# Patient Record
Sex: Female | Born: 1995 | Race: White | Hispanic: No | Marital: Single | Smoking: Never smoker
Health system: Southern US, Community
[De-identification: ages and names within clinical notes are randomized; demographics above are authoritative.]

## PROBLEM LIST (undated history)

## (undated) ENCOUNTER — Emergency Department: Admission: EM | Discharge status: Absent without leave | Payer: Self-pay

## (undated) DIAGNOSIS — K589 Irritable bowel syndrome without diarrhea: Secondary | ICD-10-CM

## (undated) DIAGNOSIS — F419 Anxiety disorder, unspecified: Secondary | ICD-10-CM

## (undated) DIAGNOSIS — L709 Acne, unspecified: Secondary | ICD-10-CM

## (undated) DIAGNOSIS — F329 Major depressive disorder, single episode, unspecified: Secondary | ICD-10-CM

## (undated) DIAGNOSIS — F32A Depression, unspecified: Secondary | ICD-10-CM

## (undated) HISTORY — DX: Anxiety disorder, unspecified: F41.9

## (undated) HISTORY — DX: Irritable bowel syndrome, unspecified: K58.9

## (undated) HISTORY — DX: Depression, unspecified: F32.A

## (undated) HISTORY — DX: Acne, unspecified: L70.9

## (undated) HISTORY — DX: Major depressive disorder, single episode, unspecified: F32.9

---
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
## 2018-03-20 ENCOUNTER — Other Ambulatory Visit: Payer: Self-pay | Admitting: Internal Medicine

## 2018-03-20 ENCOUNTER — Other Ambulatory Visit (HOSPITAL_COMMUNITY)
Admission: RE | Admit: 2018-03-20 | Discharge: 2018-03-20 | Disposition: A | Discharge status: Home / Self Care | Payer: Managed Care, Other (non HMO) | Source: Ambulatory Visit | Attending: Internal Medicine | Admitting: Internal Medicine

## 2018-03-20 DIAGNOSIS — Z124 Encounter for screening for malignant neoplasm of cervix: Secondary | ICD-10-CM | POA: Insufficient documentation

## 2018-03-23 LAB — CYTOLOGY - PAP

## 2018-08-07 ENCOUNTER — Other Ambulatory Visit: Payer: Self-pay | Admitting: Gastroenterology

## 2018-08-07 DIAGNOSIS — R140 Abdominal distension (gaseous): Secondary | ICD-10-CM

## 2018-08-17 ENCOUNTER — Other Ambulatory Visit: Payer: Managed Care, Other (non HMO)

## 2018-08-25 ENCOUNTER — Other Ambulatory Visit: Payer: Managed Care, Other (non HMO)

## 2018-09-06 ENCOUNTER — Ambulatory Visit
Admission: RE | Admit: 2018-09-06 | Discharge: 2018-09-06 | Disposition: A | Discharge status: Home / Self Care | Payer: Managed Care, Other (non HMO) | Source: Ambulatory Visit | Attending: Gastroenterology | Admitting: Gastroenterology

## 2018-09-06 DIAGNOSIS — R140 Abdominal distension (gaseous): Secondary | ICD-10-CM

## 2019-03-06 ENCOUNTER — Other Ambulatory Visit: Payer: Self-pay | Admitting: Gastroenterology

## 2019-03-06 DIAGNOSIS — R1084 Generalized abdominal pain: Secondary | ICD-10-CM

## 2019-03-19 ENCOUNTER — Inpatient Hospital Stay: Admission: RE | Admit: 2019-03-19 | Payer: Managed Care, Other (non HMO) | Source: Ambulatory Visit

## 2019-05-09 ENCOUNTER — Ambulatory Visit
Admission: RE | Admit: 2019-05-09 | Discharge: 2019-05-09 | Disposition: A | Discharge status: Home / Self Care | Payer: Managed Care, Other (non HMO) | Source: Ambulatory Visit | Attending: Gastroenterology | Admitting: Gastroenterology

## 2019-05-09 ENCOUNTER — Encounter: Payer: Self-pay | Admitting: Radiology

## 2019-05-09 DIAGNOSIS — R1084 Generalized abdominal pain: Secondary | ICD-10-CM

## 2019-05-09 MED ORDER — IOPAMIDOL (ISOVUE-300) INJECTION 61%
100.0000 mL | Freq: Once | INTRAVENOUS | Status: AC | PRN
  Administered 2019-05-09: 100 mL via INTRAVENOUS

## 2019-05-28 ENCOUNTER — Other Ambulatory Visit: Payer: Managed Care, Other (non HMO)

## 2019-07-18 ENCOUNTER — Ambulatory Visit: Payer: Managed Care, Other (non HMO) | Admitting: Dietician

## 2019-08-23 ENCOUNTER — Other Ambulatory Visit: Payer: Self-pay

## 2019-08-23 ENCOUNTER — Encounter: Payer: Self-pay | Admitting: Dietician

## 2019-08-23 ENCOUNTER — Encounter: Payer: Managed Care, Other (non HMO) | Attending: Gastroenterology | Admitting: Dietician

## 2019-08-23 DIAGNOSIS — K589 Irritable bowel syndrome without diarrhea: Secondary | ICD-10-CM | POA: Diagnosis not present

## 2019-08-23 NOTE — Progress Notes (Signed)
Medical Nutrition Therapy:  Appt start time: 1130 end time:  1220.   Assessment:  Primary concerns today: Bloating, abdominal pain.   Body Composition Scale Date 08/23/2019  Current Body Weight 119.6 lbs  Total Body Fat % 15.5%  Visceral Fat 3  Fat-Free Mass % 84.4%   Total Body Water % 56.7%  Muscle-Mass lbs 32.5  BMI 19.5  Body Fat Displacement          Torso  lbs 11.4         Left Leg  lbs 2.2         Right Leg  lbs 2.2         Left Arm  lbs 1.1         Right Arm   lbs 1.1   Pt reports being seen by different providers multiple times, and doesn't know what to do. Pt reported bloating started three years ago, and became painful. Pt would go through cycles of painful bloating and remission. Pt had an ultrasound of kidneys and abdomen and a CT scan, results were negative.  Pt reports nausea recently, but no vomiting. Pt reports taking an antacid. Pt reports cutting out fiber recently, and constipation occurred. Pt reports trying to journal foods previously, getting frustrated and stopping Pt reports taking everlywell test, and avoids foods that showed up as sensitive. Pt has been paying attention to what foods cause symptoms. Pt reports alcohol usually doesn't cause symptoms. 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 Pt reports slight hair loss previously, but no brittle nails. Pt reports taking benefiber in the past, but had little difference. Pt reports hiccups, burping, and pain in her chest. Potentially indicative of GERD Pt reports drinking premier protein due to low calories but ample protein. Pt reports not wanting to gain weight. Pt reports having anxiety, and that it can intensify GI symptoms. Pt reports not having fear or anxiety over food.  Pt reports considering taking an anxiety medication. Pt reports just getting a therapist. Pt has latex allergy    Preferred Learning Style:   No preference indicated   Learning  Readiness:   Contemplating    MEDICATIONS: Acid reducer as needed, See chart   DIETARY INTAKE:  Usual eating pattern includes 3 meals and 2 snacks per day when not symptomatic.  Everyday foods include deli meat, tuna .  Avoided foods include FODMAP.    24-hr recall:  B ( AM): None reported  Snk ( AM): none reported  L ( PM): Premier proetin Snk ( PM): corn Crackers with deli meat D ( PM): premier protein Snk ( PM): crackers kiwi deli meat Beverages: water (4-5 16 oz bottles)  Usual physical activity: ADL  Progress Towards Goal(s):  In progress.   Nutritional Diagnosis:  NB-1.1 Food and nutrition-related knowledge deficit As related to IBS and bloating.  As evidenced by Continued symptoms of GI discomfort and bloating.    Intervention:  Nutrition Education.  Discuss incidence of symptoms with pt. and work with pt to identify potential combinations of foods that may lead to exacerbation of symptoms. Educate pt on the role of stress and anxiety on GI function and discomfort. Educate pt on the different forms of fiber in her food choices and her potential for intolerance in various forms. This includes inulin, which was present in pt 24 hr recall. Recommend pt take a detailed food and symptom log for the next two weeks, and follow up with dietitian. Advise  pt to contact her PCP about potentially starting an antacid.  Recommendations:  Keep track of food and symptoms. This includes all food and beverages. If you eat out, please indicate this.   Work to eat consistently throughout the day.   Begin with all cooked foods.  Chicken, fish, broccoli, peas   You can continue to eat fruits uncooked as long as they are tolerated.   Low fat yogurt, Yoplait or Activia.   Discuss the possibility of an acid reducer with your doctor.   Consider drinking alkaline water pH 8.8 and seeing how it is tolerated. You can find it at any grocery store.  Teaching Method Utilized:   Visual Auditory    Barriers to learning/adherence to lifestyle change: Frustration with inconsistency of symptoms  Demonstrated degree of understanding via:  Teach Back   Monitoring/Evaluation:  Dietary intake and symptom log, exercise, and body weight in 2 week(s).

## 2019-08-23 NOTE — Patient Instructions (Addendum)
Keep track of food and symptoms.  This includes all food and beverages, and if you eat out please indicate this. Work to eat consistently throughout the day.  Begin with all cooked foods. Chicken, fish, broccoli, peas  You can continue to eat fruits uncooked as long as they are tolerated.  Low fat yogurt, Yoplait or Activia.  Discuss the possibility of an acid reducer with your doctor.  Consider drinking alkaline water pH 8.8 and seeing how it is tolerated. You can find it at any grocery store.

## 2019-09-13 ENCOUNTER — Encounter: Payer: Managed Care, Other (non HMO) | Attending: Gastroenterology | Admitting: Dietician

## 2019-09-13 ENCOUNTER — Other Ambulatory Visit: Payer: Self-pay

## 2019-09-13 ENCOUNTER — Encounter: Payer: Self-pay | Admitting: Dietician

## 2019-09-13 VITALS — Wt 117.5 lb

## 2019-09-13 DIAGNOSIS — K589 Irritable bowel syndrome without diarrhea: Secondary | ICD-10-CM | POA: Insufficient documentation

## 2019-09-13 NOTE — Patient Instructions (Addendum)
Continue to log your food and pain intensity.  Get in 64 ounces of water a day.  Keep drinking alkaline water!  Try Benefiber supplement once daily when eliminating fruits.  Remove latex containing foods, and assess symptoms. These foods include  Avocado  Banana  Chestnut  Kiwi  Apple  Carrot  Celery  Papaya  Potatoes  Tomatoes  Melons

## 2019-09-13 NOTE — Progress Notes (Signed)
Medical Nutrition Therapy:  Appt start time: 0210 end time: 0240.   Assessment:  Primary concerns today: Bloating, abdominal pain.   Body Composition Scale Date 08/23/2019  09/13/2019  Current Body Weight 119.6 lbs 117.5 lbs  Total Body Fat % 15.5% 14.6  Visceral Fat 3 2  Fat-Free Mass % 84.4% 85.3   Total Body Water % 56.7% 57.1  Muscle-Mass lbs 32.5 32.5  BMI 19.5 19.2  Body Fat Displacement           Torso  lbs 11.4 10.5         Left Leg  lbs 2.2 2.1         Right Leg  lbs 2.2 2.1         Left Arm  lbs 1.1 1.0         Right Arm   lbs 1.1 1.0    Pt reports that if she gets pain from a food it will linger throughout the day.  Pt brought food log, rated pain on a scale of 5. 3 or 4 is where she states it becomes uncomfortable. Starts every day at a 2. Pt was on vacation and stopped logging foods after 1 week. Reports onset of symptoms within a few minutes of her first bite of food. Reports bloating constantly. Contsant pressure under her lower, left ribs. Pizza causes severe symptoms. Pt reports trying to drink a gallon of water a day for weight loss and skin health. Pt reports going for walks and runs for exercise. Exercise doesn't really affect her symptoms. Pt reports history of stress eating, and eating 80% healthy/20% "whatever" or junkfood. Pt felt she was her healthiest following this diet. Stopped taking OTC acid reducer because pain hasn't been as bad recently. Stopped socializing due to anxiety, and wants to get back out more often because she misses it.   Preferred Learning Style:   No preference indicated   Learning Readiness:   Ready   MEDICATIONS: Acid reducer as needed, no changes in medication   DIETARY INTAKE:  Usual eating pattern includes 3 meals and 2 snacks per day when not symptomatic.  Everyday foods include deli meat, tuna .  Avoided foods include FODMAP.    24-hr recall:  B ( AM): Yogurt, 2 kiwi fruit Snk ( AM): none L ( PM): [employer]  brown rice with vegetables frozen dinner, [employer] coffee caramel frap Snk ( PM): Banana and peanut butter D ( PM): McDonalds fries, diet coke Snk ( PM): One Maple donut, protein bar. Beverages: water (4-5 16 oz bottles)  Usual physical activity: ADL, walks and/or runs multiple times a week  Progress Towards Goal(s):  In progress.   Nutritional Diagnosis:  NB-1.1 Food and nutrition-related knowledge deficit As related to IBS and bloating.  As evidenced by Continued symptoms of GI discomfort and bloating.    Intervention:  Nutrition Education.  Advise patient to continue logging food and symptoms, but also indicate episodes of stress and/or anxiety on the log as well. Educate patient on the risks of overconsumption of water, recommend 64 oz a day for adequate hydration. Educate patient on latex containing foods, and advise patient to eliminate these foods from her diet. If symptoms subside, slowly reintroduce foods to assess tolerance.  Recommendations:  Continue to log your food and pain intensity.  Get in 64 ounces of water a day.  Keep drinking alkaline water!  Try Benefiber supplement once daily when eliminating fruits.  Remove latex containing foods, and assess symptoms. These  foods include  Avocado  Banana  Chestnut  Kiwi  Apple  Carrot  Celery  Papaya  Potatoes  Tomatoes  Melons  Teaching Method Utilized:  Visual Auditory    Barriers to learning/adherence to lifestyle change: None identifed  Demonstrated degree of understanding via:  Teach Back   Monitoring/Evaluation:  Dietary intake and symptom log, exercise, and body weight in 2 week(s).

## 2019-09-28 ENCOUNTER — Encounter: Payer: Managed Care, Other (non HMO) | Admitting: Dietician

## 2019-10-26 ENCOUNTER — Ambulatory Visit: Payer: Managed Care, Other (non HMO) | Admitting: Dietician

## 2020-11-15 IMAGING — CT CT ABD-PELV W/ CM
2 of 4 series · 16 of 46 positions shown, 18 images · IV contrast (iopamidol)
Comparison: Abdominopelvic ultrasound 09/06/2018.

CLINICAL DATA: Intermittent bloating for 1 year. No prior surgery
or cancer.

EXAM:
CT ABDOMEN AND PELVIS WITH CONTRAST
TECHNIQUE: Multidetector CT imaging of the abdomen and pelvis was performed
using the standard protocol following bolus administration of
intravenous contrast.
CONTRAST:  100mL YBJO7G-OBB IOPAMIDOL (YBJO7G-OBB) INJECTION 61%

[Series 2: abd pelvis 5.00 br40 s3 axial · axial · 0.62mm/px · z∈[+1071,+1491]mm · 13 of 92 slices shown, 15 images]
[im 4/92  soft-tissue]
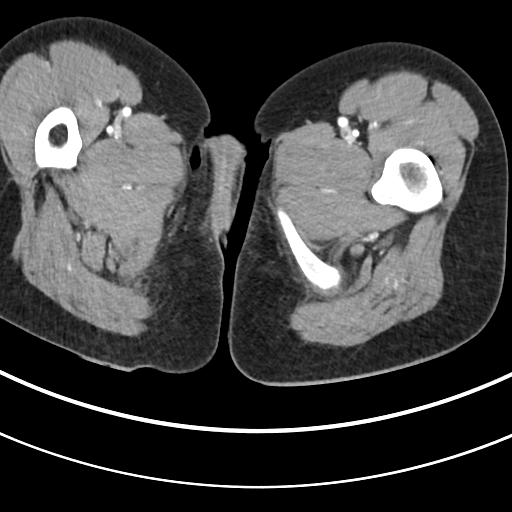
[im 4/92  bone]
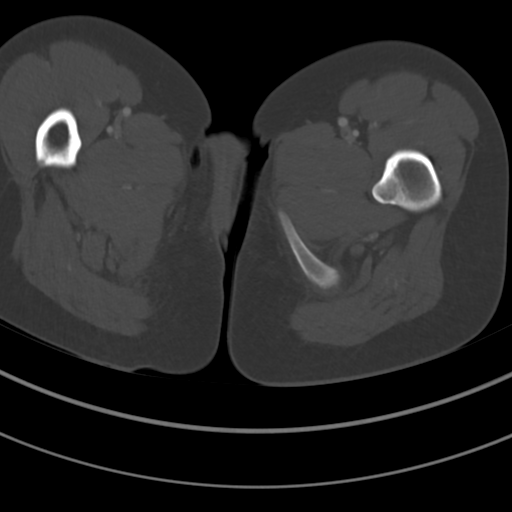
[im 12/92  soft-tissue]
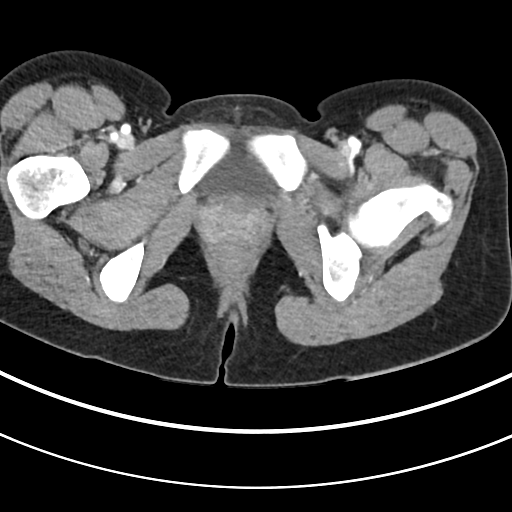
[im 20/92  soft-tissue]
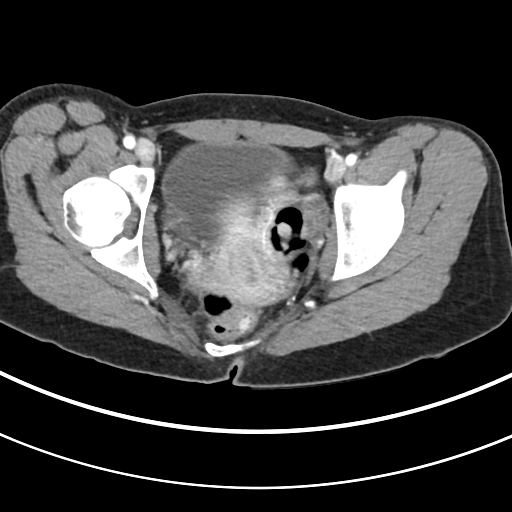
[im 24/92  soft-tissue]
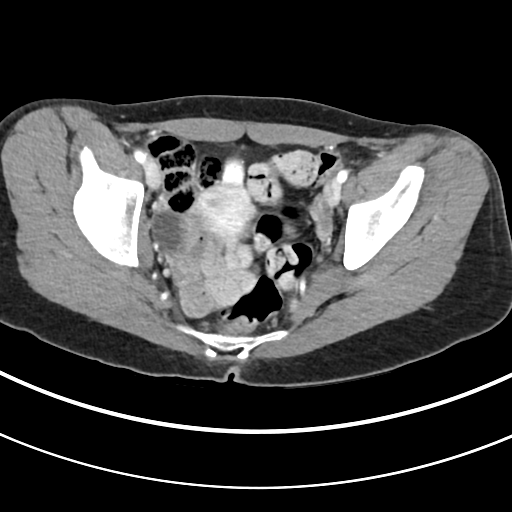
[im 32/92  soft-tissue]
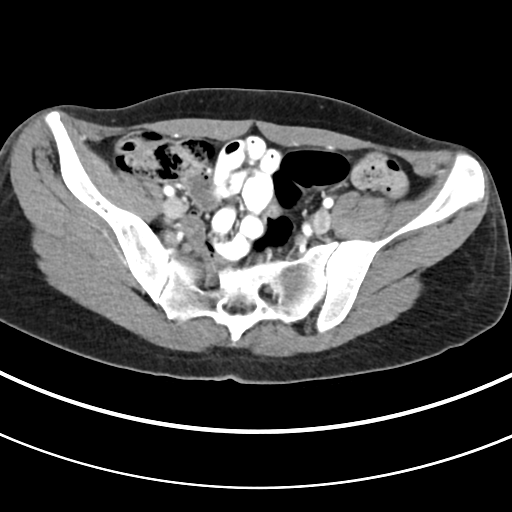
[im 40/92  soft-tissue]
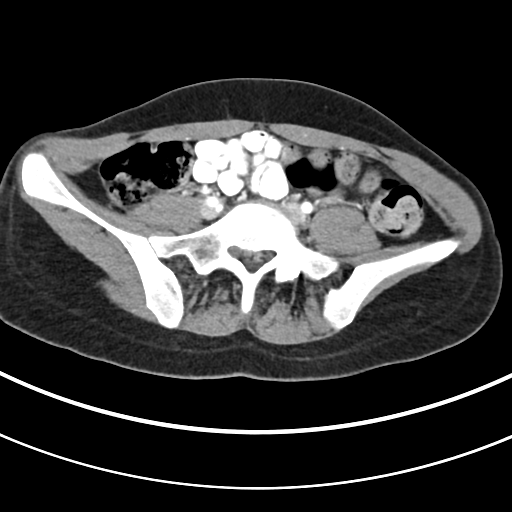
[im 48/92  soft-tissue]
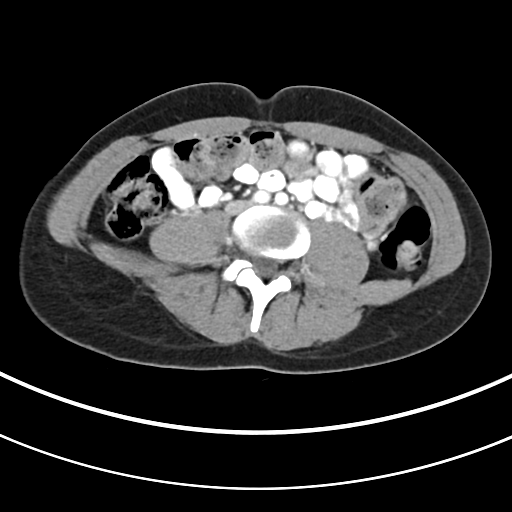
[im 52/92  soft-tissue]
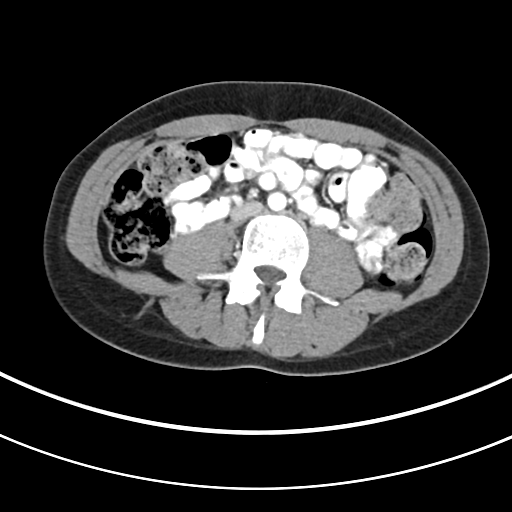
[im 60/92  soft-tissue]
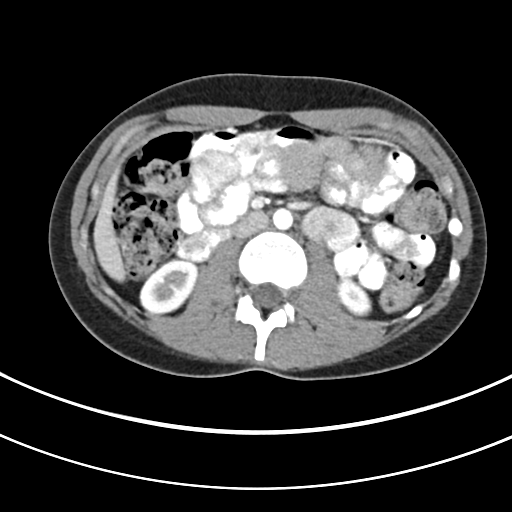
[im 60/92  bone]
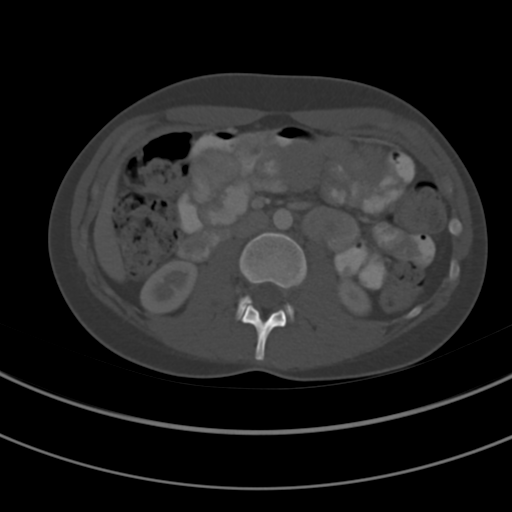
[im 68/92  soft-tissue]
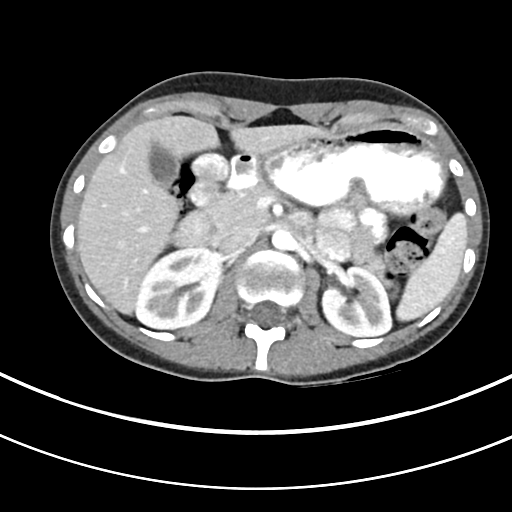
[im 72/92  soft-tissue]
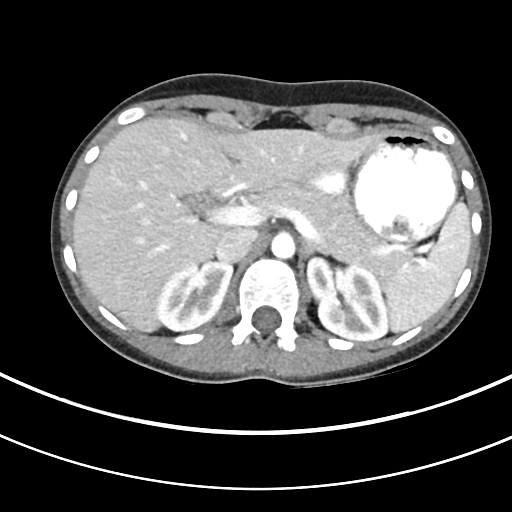
[im 80/92  soft-tissue]
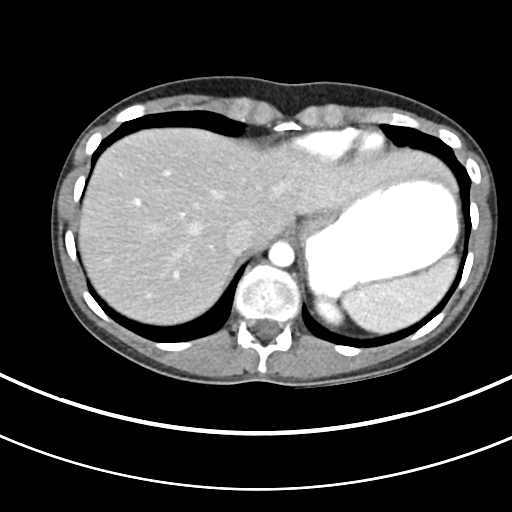
[im 88/92  soft-tissue]
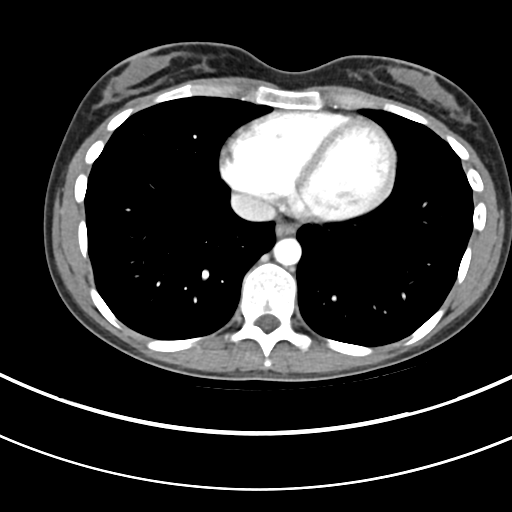

[Series 6: abd pelvis 2.00 br40 s3 cor · coronal · 0.65mm/px · 3 of 118 slices shown]
[im 40/118  soft-tissue]
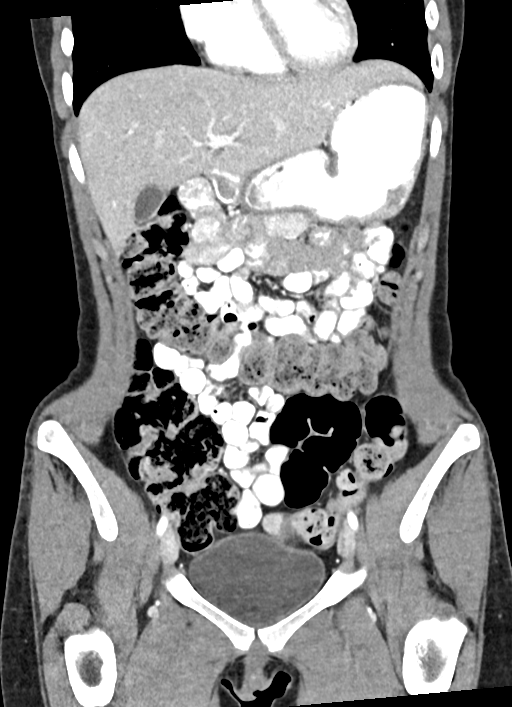
[im 53/118  soft-tissue]
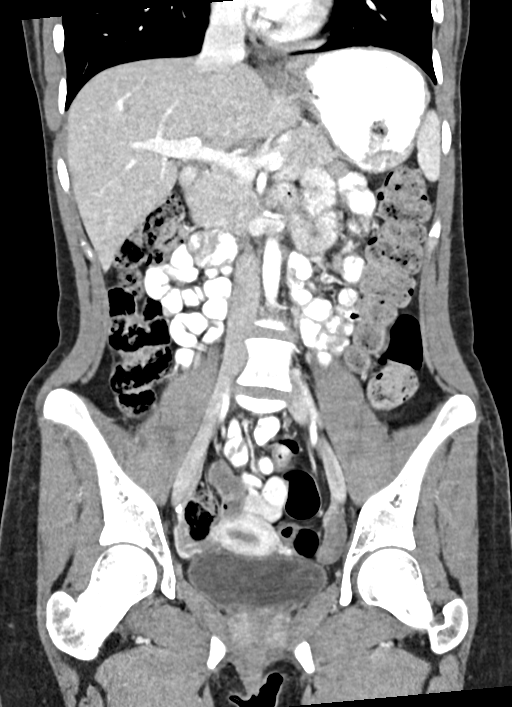
[im 66/118  soft-tissue]
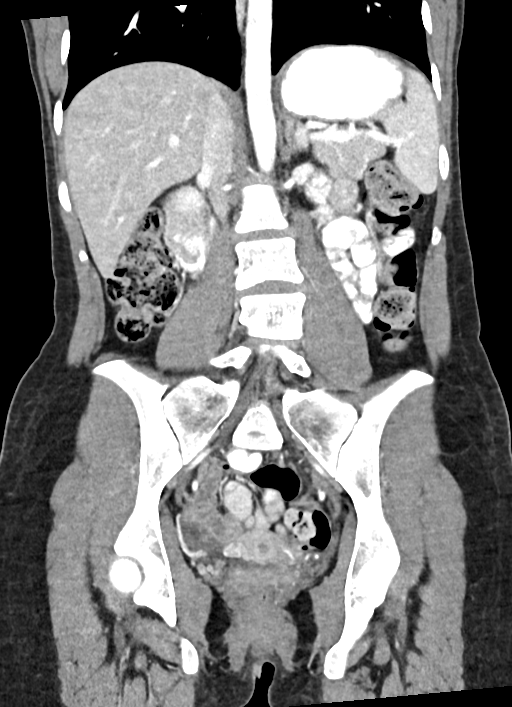

[16 of 46 positions shown; findings below may reference images not displayed]

FINDINGS: Lower chest: Clear lung bases. Normal heart size without pericardial
or pleural effusion.

Hepatobiliary: Normal liver. Normal gallbladder, without biliary
ductal dilatation.

Pancreas: Normal, without mass or ductal dilatation.

Spleen: Normal in size, without focal abnormality.

Adrenals/Urinary Tract: Normal adrenal glands. Normal kidneys,
without hydronephrosis. Normal urinary bladder.

Stomach/Bowel: Normal stomach, without wall thickening. Colonic
stool burden suggests constipation. Normal terminal ileum and
appendix. Normal small bowel.

Vascular/Lymphatic: Normal caliber of the aorta and branch vessels.
No abdominopelvic adenopathy.

Reproductive: Normal uterus and adnexa.

Other: No significant free fluid.

Musculoskeletal: Mild convex left lumbar spine curvature.
IMPRESSION: Possible constipation.  Otherwise normal abdominopelvic CT.

## 2021-01-21 DIAGNOSIS — L03011 Cellulitis of right finger: Secondary | ICD-10-CM | POA: Diagnosis not present

## 2021-01-21 DIAGNOSIS — L03012 Cellulitis of left finger: Secondary | ICD-10-CM | POA: Diagnosis not present

## 2021-01-28 DIAGNOSIS — F411 Generalized anxiety disorder: Secondary | ICD-10-CM | POA: Diagnosis not present

## 2021-02-26 DIAGNOSIS — Z872 Personal history of diseases of the skin and subcutaneous tissue: Secondary | ICD-10-CM | POA: Diagnosis not present

## 2021-03-11 DIAGNOSIS — F411 Generalized anxiety disorder: Secondary | ICD-10-CM | POA: Diagnosis not present

## 2021-03-23 DIAGNOSIS — M25552 Pain in left hip: Secondary | ICD-10-CM | POA: Diagnosis not present

## 2021-06-26 DIAGNOSIS — F411 Generalized anxiety disorder: Secondary | ICD-10-CM | POA: Diagnosis not present

## 2021-07-21 DIAGNOSIS — R1084 Generalized abdominal pain: Secondary | ICD-10-CM | POA: Diagnosis not present

## 2021-07-21 DIAGNOSIS — R140 Abdominal distension (gaseous): Secondary | ICD-10-CM | POA: Diagnosis not present

## 2021-10-06 DIAGNOSIS — F411 Generalized anxiety disorder: Secondary | ICD-10-CM | POA: Diagnosis not present

## 2022-01-05 DIAGNOSIS — F411 Generalized anxiety disorder: Secondary | ICD-10-CM | POA: Diagnosis not present

## 2022-03-30 DIAGNOSIS — F411 Generalized anxiety disorder: Secondary | ICD-10-CM | POA: Diagnosis not present

## 2022-05-10 DIAGNOSIS — F411 Generalized anxiety disorder: Secondary | ICD-10-CM | POA: Diagnosis not present

## 2022-05-17 DIAGNOSIS — F902 Attention-deficit hyperactivity disorder, combined type: Secondary | ICD-10-CM | POA: Diagnosis not present

## 2022-05-17 DIAGNOSIS — F411 Generalized anxiety disorder: Secondary | ICD-10-CM | POA: Diagnosis not present

## 2022-06-14 DIAGNOSIS — F902 Attention-deficit hyperactivity disorder, combined type: Secondary | ICD-10-CM | POA: Diagnosis not present

## 2022-06-14 DIAGNOSIS — F411 Generalized anxiety disorder: Secondary | ICD-10-CM | POA: Diagnosis not present

## 2022-06-22 DIAGNOSIS — Z131 Encounter for screening for diabetes mellitus: Secondary | ICD-10-CM | POA: Diagnosis not present

## 2022-06-22 DIAGNOSIS — Z0000 Encounter for general adult medical examination without abnormal findings: Secondary | ICD-10-CM | POA: Diagnosis not present

## 2022-06-22 DIAGNOSIS — F411 Generalized anxiety disorder: Secondary | ICD-10-CM | POA: Diagnosis not present

## 2022-06-22 DIAGNOSIS — F902 Attention-deficit hyperactivity disorder, combined type: Secondary | ICD-10-CM | POA: Diagnosis not present

## 2022-06-28 DIAGNOSIS — H52223 Regular astigmatism, bilateral: Secondary | ICD-10-CM | POA: Diagnosis not present

## 2022-06-29 DIAGNOSIS — F411 Generalized anxiety disorder: Secondary | ICD-10-CM | POA: Diagnosis not present

## 2022-06-29 DIAGNOSIS — F902 Attention-deficit hyperactivity disorder, combined type: Secondary | ICD-10-CM | POA: Diagnosis not present

## 2022-07-21 DIAGNOSIS — H6011 Cellulitis of right external ear: Secondary | ICD-10-CM | POA: Diagnosis not present

## 2022-08-10 DIAGNOSIS — F902 Attention-deficit hyperactivity disorder, combined type: Secondary | ICD-10-CM | POA: Diagnosis not present

## 2022-08-10 DIAGNOSIS — F411 Generalized anxiety disorder: Secondary | ICD-10-CM | POA: Diagnosis not present

## 2023-02-08 ENCOUNTER — Other Ambulatory Visit: Payer: Self-pay | Admitting: Obstetrics

## 2023-02-08 DIAGNOSIS — N6459 Other signs and symptoms in breast: Secondary | ICD-10-CM

## 2023-09-04 ENCOUNTER — Emergency Department (HOSPITAL_COMMUNITY)
Admission: EM | Admit: 2023-09-04 | Discharge: 2023-09-04 | Disposition: A | Discharge status: Home / Self Care | Payer: Self-pay | Attending: Emergency Medicine | Admitting: Emergency Medicine

## 2023-09-04 ENCOUNTER — Encounter (HOSPITAL_COMMUNITY): Payer: Self-pay | Admitting: Emergency Medicine

## 2023-09-04 DIAGNOSIS — Z91040 Latex allergy status: Secondary | ICD-10-CM | POA: Insufficient documentation

## 2023-09-04 DIAGNOSIS — R55 Syncope and collapse: Secondary | ICD-10-CM | POA: Insufficient documentation

## 2023-09-04 LAB — COMPREHENSIVE METABOLIC PANEL WITH GFR
ALT: 11 U/L (ref 0–44)
AST: 19 U/L (ref 15–41)
Albumin: 3.7 g/dL (ref 3.5–5.0)
Alkaline Phosphatase: 87 U/L (ref 38–126)
Anion gap: 12 (ref 5–15)
BUN: 16 mg/dL (ref 6–20)
CO2: 21 mmol/L — ABNORMAL LOW (ref 22–32)
Calcium: 9.7 mg/dL (ref 8.9–10.3)
Chloride: 103 mmol/L (ref 98–111)
Creatinine, Ser: 0.78 mg/dL (ref 0.44–1.00)
GFR, Estimated: >60 mL/min (ref >60)
Glucose, Bld: 93 mg/dL (ref 70–99)
Potassium: 3.7 mmol/L (ref 3.5–5.1)
Sodium: 136 mmol/L (ref 135–145)
Total Bilirubin: 0.4 mg/dL (ref 0.0–1.2)
Total Protein: 8 g/dL (ref 6.5–8.1)

## 2023-09-04 LAB — URINALYSIS, ROUTINE W REFLEX MICROSCOPIC: RBC / HPF: >50 RBC/hpf (ref 0–5)

## 2023-09-04 LAB — CBC
HCT: 41.2 % (ref 36.0–46.0)
Hemoglobin: 13.6 g/dL (ref 12.0–15.0)
MCH: 28.4 pg (ref 26.0–34.0)
MCHC: 33 g/dL (ref 30.0–36.0)
MCV: 86 fL (ref 80.0–100.0)
Platelets: 329 K/uL (ref 150–400)
RBC: 4.79 MIL/uL (ref 3.87–5.11)
RDW: 12.8 % (ref 11.5–15.5)
WBC: 8.7 K/uL (ref 4.0–10.5)
nRBC: 0 % (ref 0.0–0.2)

## 2023-09-04 LAB — HCG, QUANTITATIVE, PREGNANCY: hCG, Beta Chain, Quant, S: 1 m[IU]/mL (ref <5)

## 2023-09-04 NOTE — ED Triage Notes (Signed)
 Pt here from home with c/o dizziness , pt states that she does have a hx of anemia and stopped taking her iron about 1 month ago

## 2023-09-04 NOTE — ED Notes (Signed)
 Pt ambulated to bathroom, steady gait/no complaints

## 2023-09-04 NOTE — ED Provider Notes (Signed)
 [place] EMERGENCY DEPARTMENT AT [facility] Provider Note   CSN: [id number] Arrival date & time: 09/04/23  9970     Patient presents with: Dizziness   Kayla Fletcher is a 28 y.o. female.   The history is provided by the patient.  Dizziness Kayla Fletcher is a 28 y.o. female who presents to the Emergency Department complaining of lightheaded. She presents the emergency department for evaluation of symptoms that started today. She states that she feels like she's gonna pass out with generalized weakness, low energy. She has experienced similar episodes in the past secondary to heavy menstrual cycle. She did start her cycle yesterday. No fever, chest pain, shortness of breath, abdominal pain, leg swelling or pain. She does report mild nausea. No dysuria. She has no known medical problems. She does have a history of anxiety. She has no history of DVT or PE. She does not take any hormone medications. No tobacco, occasional alcohol.        Prior to Admission medications   Medication Sig Start Date End Date Taking? Authorizing Provider  ampicillin (PRINCIPEN) 500 MG capsule  02/22/12   [provider]  FLUoxetine  (PROZAC ) 20 MG capsule TAKE 3 CAPSULES (60 MG TOTAL) BY MOUTH DAILY. 04/27/13   Fredia Julian, MD  methylphenidate  (CONCERTA ) 18 MG CR tablet Take 1 tablet (18 mg total) by mouth every morning. 03/09/12   Georgina Ronal SQUIBB, MD  methylphenidate  (CONCERTA ) 27 MG CR tablet Take 1 tablet (27 mg total) by mouth every morning. Fill on or after 06/22/2012. 06/15/12   Puthuvel, Karene PARAS, MD  methylphenidate  (CONCERTA ) 27 MG CR tablet Take 1 tablet (27 mg total) by mouth every morning. Fill after 07/29/12 07/29/12   Georgina Ronal SQUIBB, MD  methylphenidate  (CONCERTA ) 27 MG CR tablet Take 1 tablet (27 mg total) by mouth every morning. Fill after 09/09/12 09/09/12   Georgina Ronal SQUIBB, MD  methylphenidate  (CONCERTA ) 27 MG CR tablet Take 1 tablet (27 mg total) by mouth every morning.  11/10/12   Georgina Ronal SQUIBB, MD  methylphenidate  (CONCERTA ) 27 MG CR tablet Take 1 tablet (27 mg total) by mouth every morning. Fill after 12/10/12 11/10/12   Georgina Ronal SQUIBB, MD  methylphenidate  (CONCERTA ) 27 MG CR tablet Take 1 tablet (27 mg total) by mouth every morning. Fill after 01/09/13 11/10/12   Georgina Ronal SQUIBB, MD  methylphenidate  (CONCERTA ) 27 MG CR tablet Take 1 tablet (27 mg total) by mouth every morning. 01/29/13   Fredia Julian, MD  methylphenidate  (CONCERTA ) 27 MG CR tablet Take 1 tablet (27 mg total) by mouth every morning. 01/29/13   Fredia Julian, MD  methylphenidate  (CONCERTA ) 27 MG PO CR tablet Take 1 tablet (27 mg total) by mouth every morning. 05/18/13   Fredia Julian, MD  risperiDONE  (RISPERDAL ) 0.5 MG tablet TAKE 1 TABLET BY MOUTH AT BEDTIME. 01/29/13   Fredia Julian, MD  VELTIN gel  12/23/10   [provider]    Allergies: Latex    Review of Systems  Neurological:  Positive for dizziness.  All other systems reviewed and are negative.   Updated Vital Signs BP 111/81 (BP Location: Right Arm)   Pulse 81   Temp 99 F (37.2 C)   Resp 18   SpO2 99%   Physical Exam Vitals and nursing note reviewed.  Constitutional:      Appearance: She is well-developed.  HENT:     Head: Normocephalic and atraumatic.  Cardiovascular:     Rate and Rhythm:  Normal rate and regular rhythm.     Heart sounds: No murmur heard. Pulmonary:     Effort: Pulmonary effort is normal. No respiratory distress.     Breath sounds: Normal breath sounds.  Abdominal:     Palpations: Abdomen is soft.     Tenderness: There is no abdominal tenderness. There is no guarding or rebound.  Musculoskeletal:        General: No tenderness.  Skin:    General: Skin is warm and dry.  Neurological:     Mental Status: She is alert and oriented to person, place, and time.     Comments: 5/5 strength in all four extremities with sensation to light touch intact in all four extremities.   Psychiatric:         Behavior: Behavior normal.     (all labs ordered are listed, but only abnormal results are displayed) Labs Reviewed  COMPREHENSIVE METABOLIC PANEL WITH GFR - Abnormal; Notable for the following components:      Result Value   CO2 21 (*)    All other components within normal limits  URINALYSIS, ROUTINE W REFLEX MICROSCOPIC - Abnormal; Notable for the following components:   Color, Urine RED (*)    APPearance TURBID (*)    Glucose, UA   (*)    Value: TEST NOT REPORTED DUE TO COLOR INTERFERENCE OF URINE PIGMENT   Hgb urine dipstick   (*)    Value: TEST NOT REPORTED DUE TO COLOR INTERFERENCE OF URINE PIGMENT   Bilirubin Urine   (*)    Value: TEST NOT REPORTED DUE TO COLOR INTERFERENCE OF URINE PIGMENT   Ketones, ur   (*)    Value: TEST NOT REPORTED DUE TO COLOR INTERFERENCE OF URINE PIGMENT   Protein, ur   (*)    Value: TEST NOT REPORTED DUE TO COLOR INTERFERENCE OF URINE PIGMENT   Nitrite   (*)    Value: TEST NOT REPORTED DUE TO COLOR INTERFERENCE OF URINE PIGMENT   Leukocytes,Ua   (*)    Value: TEST NOT REPORTED DUE TO COLOR INTERFERENCE OF URINE PIGMENT   Bacteria, UA FEW (*)    All other components within normal limits  CBC  HCG, QUANTITATIVE, PREGNANCY    EKG: EKG Interpretation Date/Time:  Sunday September 04 2023 03:58:57 EDT Ventricular Rate:  74 PR Interval:  165 QRS Duration:  87 QT Interval:  371 QTC Calculation: 412 R Axis:   63  Text Interpretation: Sinus rhythm Confirmed by Griselda Norris [phone]) on 09/04/2023 4:24:52 AM  Radiology: No results found.   Procedures   Medications Ordered in the ED - No data to display                                  Medical Decision Making Amount and/or Complexity of Data Reviewed Labs: ordered.   Patient here for evaluation of generalized weakness. She is non-toxic appearing on evaluation with no focal neurologic deficits. No systemic symptoms concerning for infection. UA is difficult to interpret secondary to  blood, does not appear to be consistent with UTI. CBC without significant anemia. Renal function is within normal limits and electrolytes are unremarkable. Current picture is not consistent with PE, sepsis, arrhythmia. Discussed oral fluid hydration, rest, slow transitions for movement. Current picture is not consistent with CVA. Feels she is stable to discharge home with outpatient follow-up and return precautions.     Final diagnoses:  Near syncope    ED Discharge Orders     None          Griselda Norris, MD 09/04/23 [phone]

## 2023-09-04 NOTE — ED Notes (Signed)
 Patient given water. Call bell within reach, patient educated to press red button if she needs anything.
